# Patient Record
Sex: Female | Born: 1965
Health system: Southern US, Community
[De-identification: ages and names within clinical notes are randomized; demographics above are authoritative.]

## PROBLEM LIST (undated history)

## (undated) DIAGNOSIS — K219 Gastro-esophageal reflux disease without esophagitis: Secondary | ICD-10-CM

## (undated) DIAGNOSIS — D649 Anemia, unspecified: Secondary | ICD-10-CM

## (undated) DIAGNOSIS — G43909 Migraine, unspecified, not intractable, without status migrainosus: Secondary | ICD-10-CM

## (undated) DIAGNOSIS — G4700 Insomnia, unspecified: Secondary | ICD-10-CM

## (undated) DIAGNOSIS — F419 Anxiety disorder, unspecified: Secondary | ICD-10-CM

## (undated) DIAGNOSIS — M1990 Unspecified osteoarthritis, unspecified site: Secondary | ICD-10-CM

## (undated) HISTORY — DX: Migraine, unspecified, not intractable, without status migrainosus: G43.909

## (undated) HISTORY — DX: Unspecified osteoarthritis, unspecified site: M19.90

## (undated) HISTORY — PX: UPPER GASTROINTESTINAL ENDOSCOPY: SHX188

## (undated) HISTORY — DX: Anemia, unspecified: D64.9

## (undated) HISTORY — DX: Anxiety disorder, unspecified: F41.9

## (undated) HISTORY — DX: Insomnia, unspecified: G47.00

## (undated) HISTORY — DX: Gastro-esophageal reflux disease without esophagitis: K21.9

---

## 1999-06-29 ENCOUNTER — Other Ambulatory Visit: Admission: RE | Admit: 1999-06-29 | Discharge: 1999-06-29 | Payer: Self-pay | Admitting: Obstetrics and Gynecology

## 2000-06-27 ENCOUNTER — Other Ambulatory Visit: Admission: RE | Admit: 2000-06-27 | Discharge: 2000-06-27 | Payer: Self-pay | Admitting: Obstetrics and Gynecology

## 2001-08-12 ENCOUNTER — Other Ambulatory Visit: Admission: RE | Admit: 2001-08-12 | Discharge: 2001-08-12 | Payer: Self-pay | Admitting: Obstetrics and Gynecology

## 2008-12-30 HISTORY — PX: TUBAL LIGATION: SHX77

## 2010-07-12 ENCOUNTER — Ambulatory Visit (HOSPITAL_COMMUNITY): Admission: RE | Admit: 2010-07-12 | Discharge: 2010-07-12 | Payer: Self-pay | Admitting: Internal Medicine

## 2011-03-17 LAB — COMPREHENSIVE METABOLIC PANEL
ALT: 33 U/L (ref 0–35)
AST: 31 U/L (ref 0–37)
CO2: 30 mEq/L (ref 19–32)
Calcium: 10 mg/dL (ref 8.4–10.5)
Creatinine, Ser: 0.88 mg/dL (ref 0.4–1.2)
GFR calc Af Amer: >60 mL/min (ref >60)
GFR calc non Af Amer: >60 mL/min (ref >60)
Sodium: 139 mEq/L (ref 135–145)
Total Protein: 7.8 g/dL (ref 6.0–8.3)

## 2011-03-17 LAB — CBC
HCT: 41.5 % (ref 36.0–46.0)
Hemoglobin: 13.7 g/dL (ref 12.0–15.0)
MCHC: 33 g/dL (ref 30.0–36.0)
MCV: 84.9 fL (ref 78.0–100.0)
RDW: 13.4 % (ref 11.5–15.5)

## 2011-03-17 LAB — DIFFERENTIAL
Basophils Relative: 0 % (ref 0–1)
Eosinophils Relative: 1 % (ref 0–5)
Lymphocytes Relative: 39 % (ref 12–46)
Monocytes Absolute: 0.5 10*3/uL (ref 0.1–1.0)
Monocytes Relative: 19 % — ABNORMAL HIGH (ref 3–12)
Neutro Abs: 1 10*3/uL — ABNORMAL LOW (ref 1.7–7.7)

## 2012-08-03 ENCOUNTER — Ambulatory Visit (INDEPENDENT_AMBULATORY_CARE_PROVIDER_SITE_OTHER): Payer: 59 | Admitting: Medical

## 2012-08-03 ENCOUNTER — Encounter: Payer: Self-pay | Admitting: Medical

## 2012-08-03 VITALS — BP 120/80 | HR 76 | Temp 98.1°F | Resp 16 | Ht 67.0 in | Wt 134.0 lb

## 2012-08-03 DIAGNOSIS — B029 Zoster without complications: Secondary | ICD-10-CM

## 2012-08-03 MED ORDER — IBUPROFEN 800 MG PO TABS: 800.0000 mg | ORAL_TABLET | Freq: Three times a day (TID) | ORAL | Status: AC | PRN

## 2012-08-03 MED ORDER — VALACYCLOVIR HCL 1 G PO TABS: 1000.0000 mg | ORAL_TABLET | Freq: Three times a day (TID) | ORAL | Status: AC

## 2012-08-03 NOTE — Progress Notes (Signed)
Subjective: Here as a new patient today.  Was formerly Engineer, drilling.   Her husband recently started coming here as a patient.  This past started getting Veloso on her right side stretching from mid front of torso to her back.   Never had prior similar Ellerman.   She was eating tomatoes last week and thought this was an allergic reaction.  denies any recent contacts with similar Goth.   Wernick it itchy, feels like fire, moderately painful.  Using some Pamprin or Advil OTC for the pain.    Review of Systems Constitutional: -fever, +chills, -sweats, -unexpected -weight change, +fatigue ENT: -runny nose, -ear pain, -sore throat Cardiology:  -chest pain, -palpitations, -edema Respiratory: -cough, -shortness of breath, -wheezing Gastroenterology: -abdominal pain, -nausea, -vomiting, -diarrhea, -constipation  Musculoskeletal: -arthralgias, -myalgias, -joint swelling, -back pain Ophthalmology: -vision changes Urology: -dysuria, -difficulty urinating, -hematuria, -urinary frequency, -urgency Neurology: +insomnia, -headache, -weakness, -tingling, -numbness   Past Medical History  Diagnosis Date  . Insomnia   . Anxiety     Dr. Kevan Ny, Debor[place] Sprang  . Migraine      Objective:   Physical Exam  General appearance: alert, no distress, WD/WN, pleasant AA female Skin: T10 dermatome with long patch of vesicles on erythematous base extending form mid clavicular line to flank Neck: supple, no lymphadenopathy, no thyromegaly, no masses Heart: RRR, normal S1, S2, no murmurs Lungs: CTA bilaterally, no wheezes, rhonchi, or rales Abdomen: +bs, soft, non tender, non distended, no masses, no hepatomegaly, no splenomegaly Pulses: 2+ symmetric   Assessment and Plan :     Encounter Diagnosis  Name Primary?  . Shingles outbreak Yes   Discussed symptoms, diagnosis, treatment, and prevention, also discussed possibility of post herpetic neuralgia or recurrence.    Script today for Valtrex and Ibuprofen.   Advised  rest, avoid touching the area, and call or return if not improving or worse.   Answered her questions and handout given.

## 2012-08-03 NOTE — Patient Instructions (Signed)
Shingles Shingles is caused by the same virus that causes chickenpox (varicella zoster virus or VZV). Shingles often occurs many years or decades after having chickenpox. That is why it is more common in adults older than 50 years. The virus reactivates and breaks out as an infection in a nerve root. SYMPTOMS   The initial feeling (sensations) may be pain. This pain is usually described as:   Burning.   Stabbing.   Throbbing.   Tingling in the nerve root.   A red Raboin will follow in a couple days. The Reisner may occur in any area of the body and is usually on one side (unilateral) of the body in a band or belt-like pattern. The Boardman usually starts out as very small blisters (vesicles). They will dry up after 7 to 10 days. This is not usually a significant problem except for the pain it causes.   Long-lasting (chronic) pain is more likely in an elderly person. It can last months to years. This condition is called postherpetic neuralgia.  Shingles can be an extremely severe infection in someone with AIDS, a weakened immune system, or with forms of leukemia. It can also be severe if you are taking transplant medicines or other medicines that weaken the immune system. TREATMENT  Your caregiver will often treat you with:  Antiviral drugs.   Anti-inflammatory drugs.   Pain medicines.  Bed rest is very important in preventing the pain associated with herpes zoster (postherpetic neuralgia). Application of heat in the form of a hot water bottle or electric heating pad or gentle pressure with the hand is recommended to help with the pain or discomfort. PREVENTION  A varicella zoster vaccine is available to help protect against the virus. The Food and Drug Administration approved the varicella zoster vaccine for individuals 50 years of age and older. HOME CARE INSTRUCTIONS   Cool compresses to the area of Ronk may be helpful.   Only take over-the-counter or prescription medicines for pain,  discomfort, or fever as directed by your caregiver.   Avoid contact with:   Babies.   Pregnant women.   Children with eczema.   Elderly people with transplants.   People with chronic illnesses, such as leukemia and AIDS.   If the area involved is on your face, you may receive a referral for follow-up to a specialist. It is very important to keep all follow-up appointments. This will help avoid eye complications, chronic pain, or disability.  SEEK IMMEDIATE MEDICAL CARE IF:   You develop any pain (headache) in the area of the face or eye. This must be followed carefully by your caregiver or ophthalmologist. An infection in part of your eye (cornea) can be very serious. It could lead to blindness.   You do not have pain relief from prescribed medicines.   Your redness or swelling spreads.   The area involved becomes very swollen and painful.   You have a fever.   You notice any red or painful lines extending away from the affected area toward your heart (lymphangitis).   Your condition is worsening or has changed.  Document Released: 12/16/2005 Document Revised: 12/05/2011 Document Reviewed: 11/20/2009 Exit[employer] Patient Information 2012 [employer]. 

## 2012-08-05 ENCOUNTER — Other Ambulatory Visit: Payer: Self-pay | Admitting: Medical

## 2012-08-05 ENCOUNTER — Telehealth: Payer: Self-pay | Admitting: Medical

## 2012-08-05 MED ORDER — HYDROCODONE-ACETAMINOPHEN 5-500 MG PO TABS: 1.0000 | ORAL_TABLET | Freq: Four times a day (QID) | ORAL | Status: AC | PRN

## 2012-08-05 NOTE — Progress Notes (Signed)
Rx was called to [facility] per Crosby Oyster PA-C. CLS

## 2012-08-05 NOTE — Telephone Encounter (Signed)
pls call out Vicodin for pain.  Caution that this can cause sedation and constipation but should help

## 2012-08-05 NOTE — Telephone Encounter (Signed)
Patient was notified that her medication was called and the side effects. CLS

## 2012-10-29 ENCOUNTER — Other Ambulatory Visit: Payer: Self-pay | Admitting: Family Medicine

## 2012-10-29 DIAGNOSIS — Z1231 Encounter for screening mammogram for malignant neoplasm of breast: Secondary | ICD-10-CM

## 2012-12-07 ENCOUNTER — Ambulatory Visit
Admission: RE | Admit: 2012-12-07 | Discharge: 2012-12-07 | Disposition: A | Discharge status: Home / Self Care | Payer: 59 | Source: Ambulatory Visit | Attending: Family Medicine | Admitting: Family Medicine

## 2012-12-07 DIAGNOSIS — Z1231 Encounter for screening mammogram for malignant neoplasm of breast: Secondary | ICD-10-CM

## 2013-03-11 ENCOUNTER — Telehealth: Payer: Self-pay | Admitting: Medical

## 2013-03-11 NOTE — Telephone Encounter (Signed)
Pt was called because she is emergency contact for her husband, Aneta Mins Sigala, and we were unable to get in touch with him. When I talked to her to get phillip's new number she informed me that we had called in wrong dose for her celexa. I checked the system and informed pt that we didn't call in this medication. I called [facility], to check out who called it in and was informed that the medication was given in her in error. The pharmacy informed me that an error was made and she was given someone else medication. It is just a coincidence that it is the same medication both she and her husband take. [facility] that [facility] would [facility] that [facility] and get everything straightened out. I did call pt back and informed her about the error and [facility] would be [facility]. I am sending this the Abigail Allen just as an [place].

## 2013-03-12 NOTE — Telephone Encounter (Signed)
This is the one we talked about yesterday.  So did you actually call out his antidepressant?

## 2013-03-12 NOTE — Telephone Encounter (Signed)
Per you instructions yes and he scheduled an appointment

## 2014-01-07 IMAGING — MG MM DIGITAL SCREENING BILAT
4 series · 4 of 4 positions shown · non-contrast
Comparison: None.

CLINICAL DATA: Screening.

DIGITAL BILATERAL SCREENING MAMMOGRAM WITH CAD

[R CC]
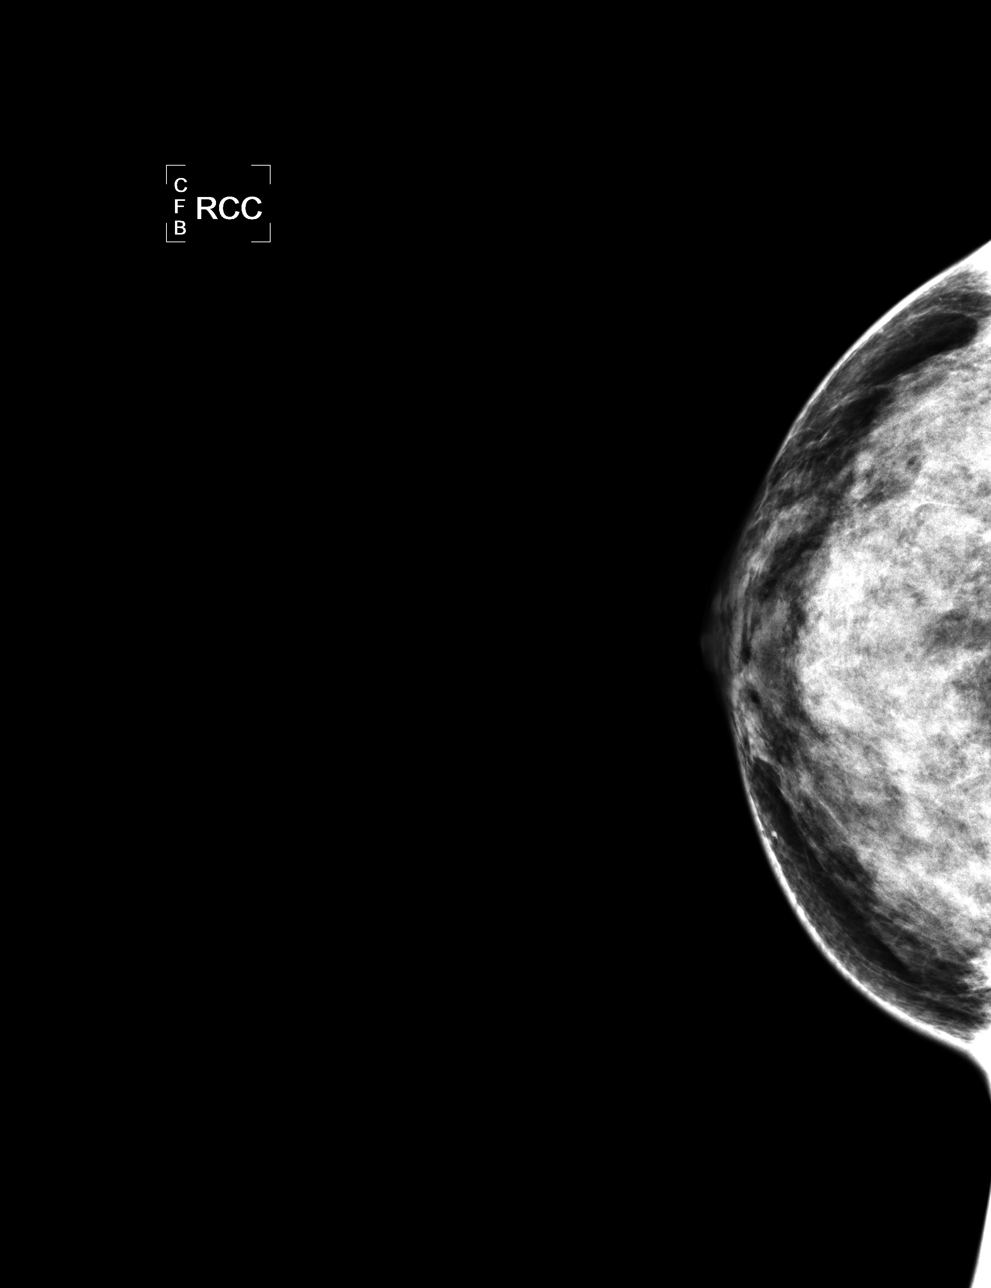

[L CC]
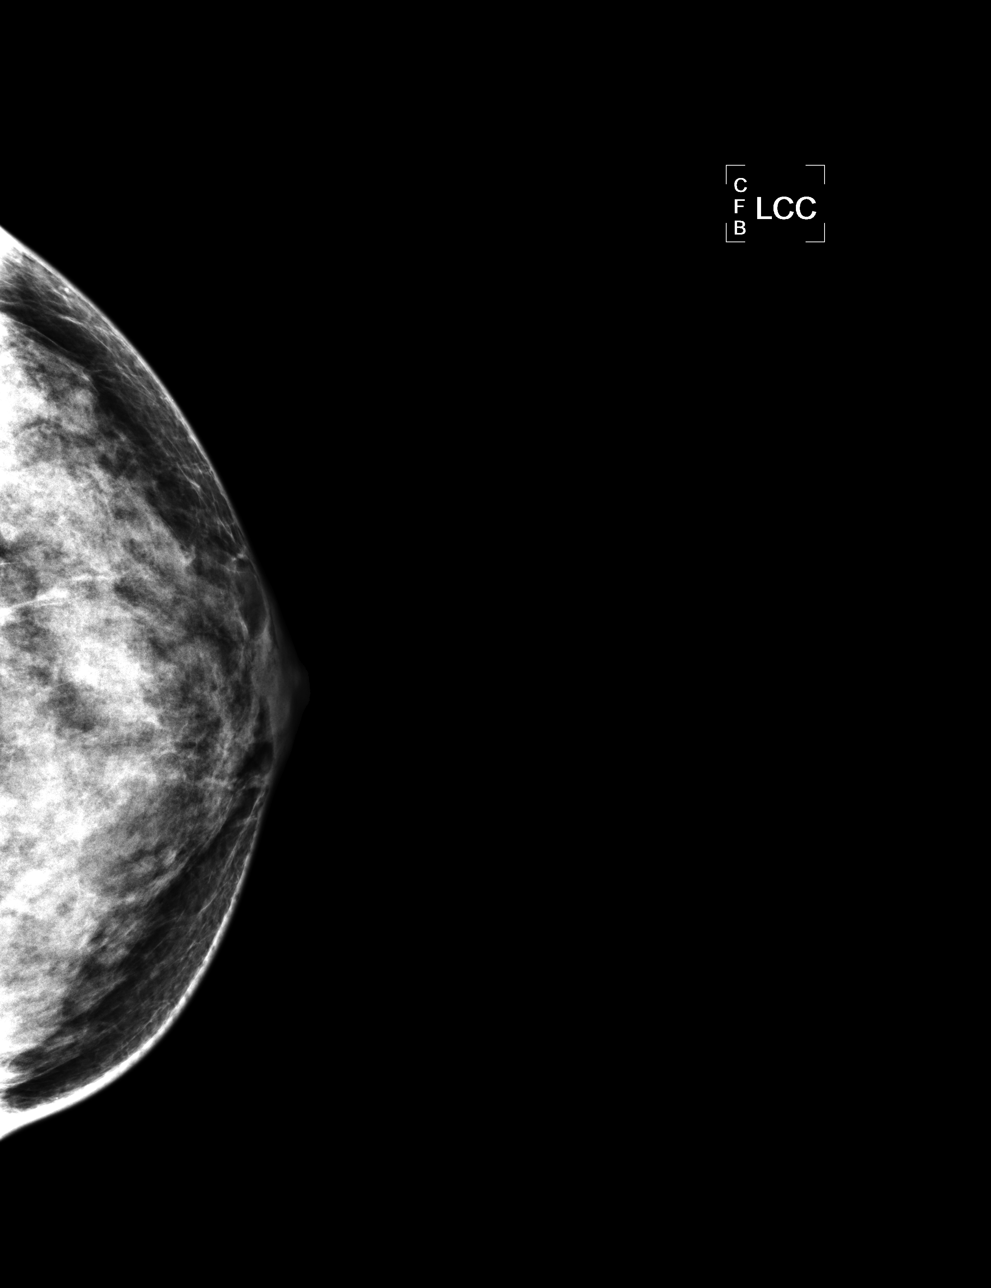

[L MLO]
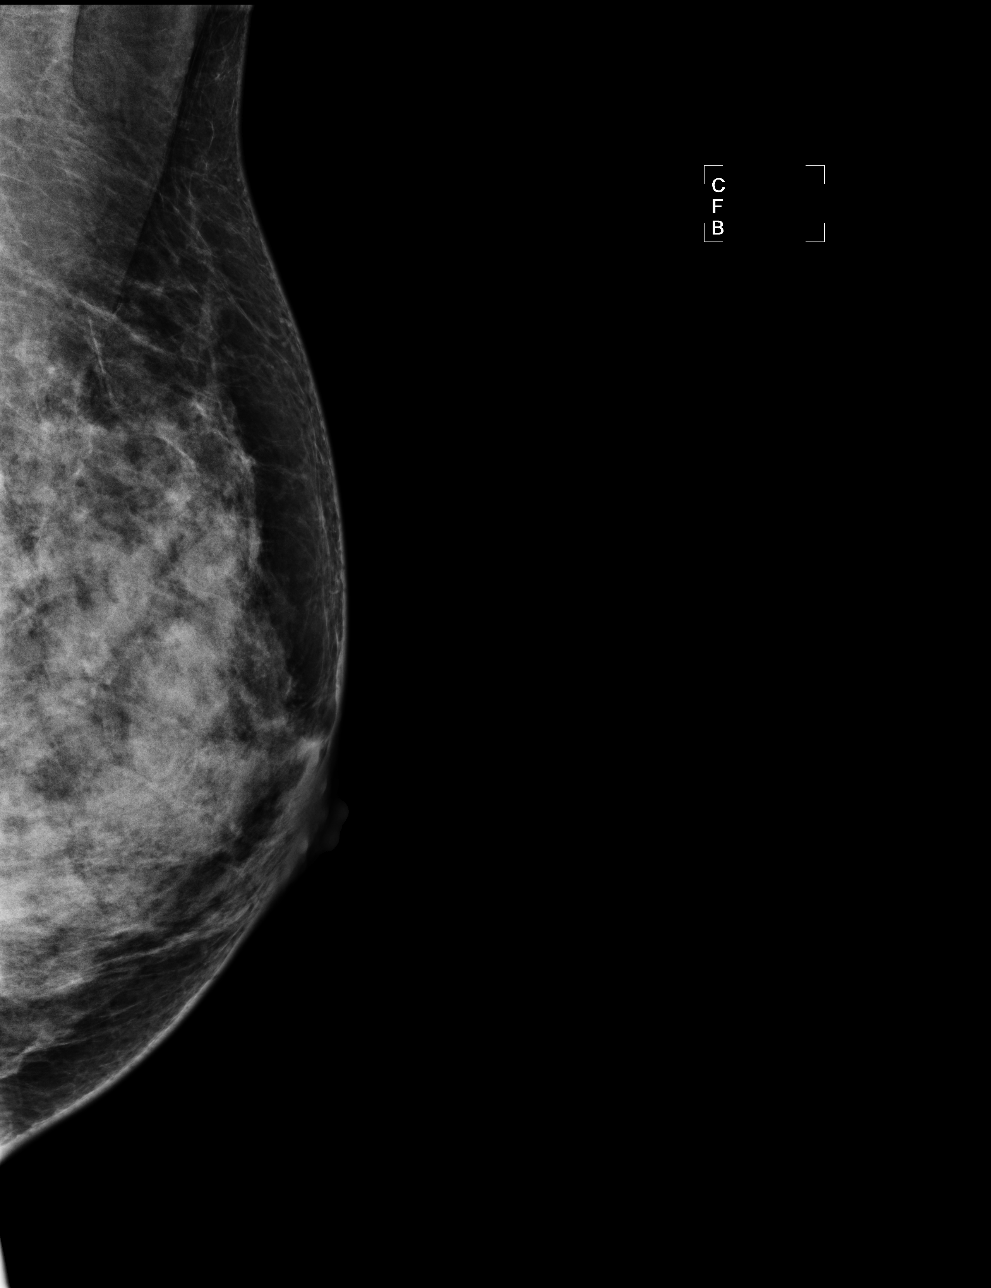

[R MLO]
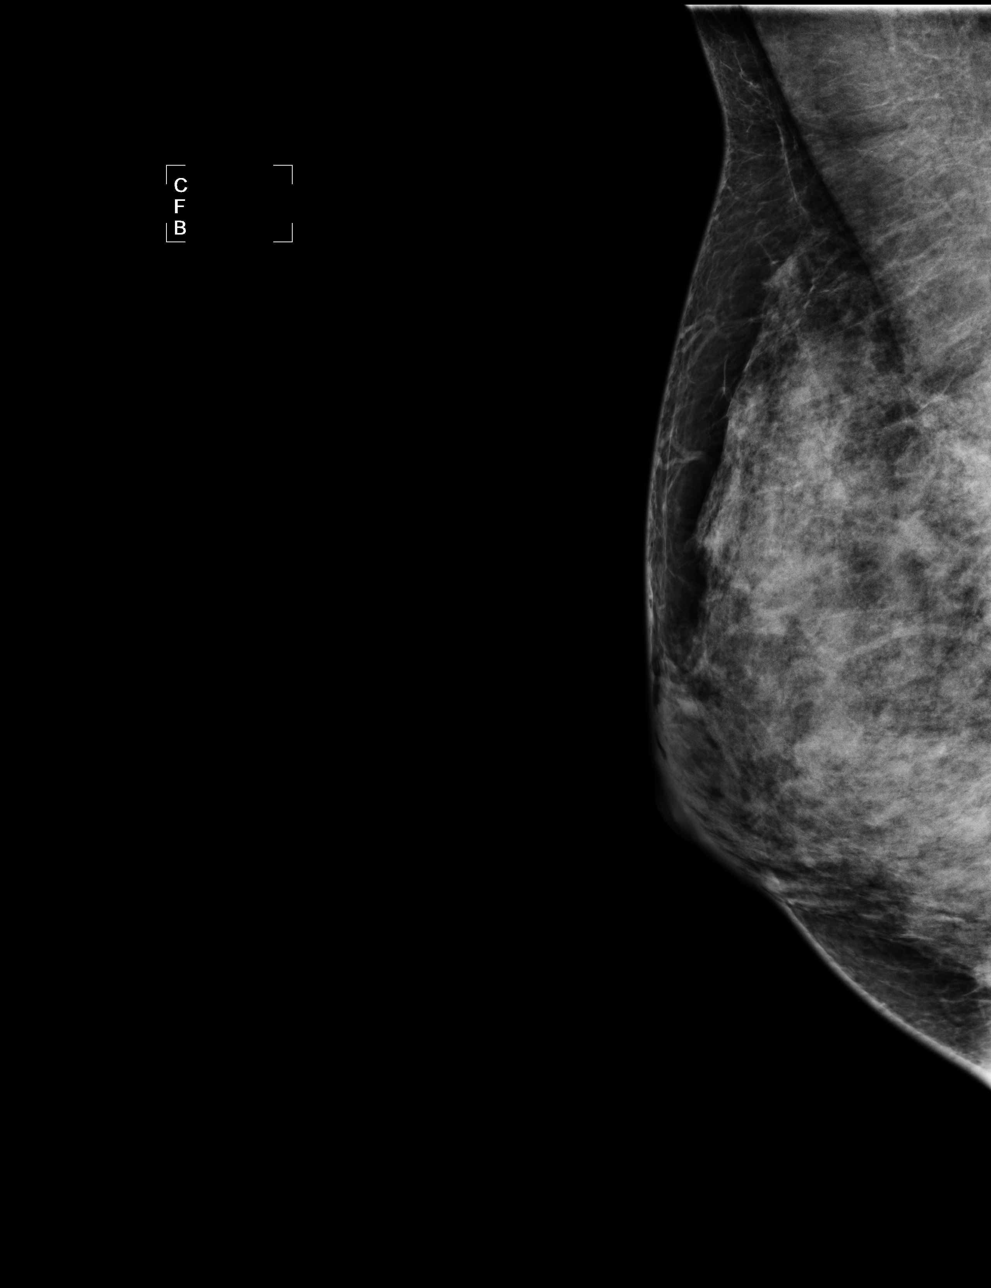

[4 of 4 positions shown; findings below may reference images not displayed]

FINDINGS: ACR Breast Density Category 4: The breast tissue is extremely
dense.

No suspicious masses, architectural distortion, or calcifications
are present.

Images were processed with CAD.
IMPRESSION: No mammographic evidence of malignancy.
A result letter of this screening mammogram will be mailed directly
to the patient.

RECOMMENDATION:
Screening mammogram in one year. (Code:HN-Z-2X1)

BI-RADS CATEGORY 1:  Negative.

## 2016-12-06 ENCOUNTER — Encounter: Payer: Self-pay | Admitting: Physician Assistant

## 2016-12-13 ENCOUNTER — Other Ambulatory Visit (INDEPENDENT_AMBULATORY_CARE_PROVIDER_SITE_OTHER): Payer: 59

## 2016-12-13 ENCOUNTER — Encounter: Payer: Self-pay | Admitting: Physician Assistant

## 2016-12-13 ENCOUNTER — Ambulatory Visit (INDEPENDENT_AMBULATORY_CARE_PROVIDER_SITE_OTHER): Payer: 59 | Admitting: Physician Assistant

## 2016-12-13 ENCOUNTER — Encounter (INDEPENDENT_AMBULATORY_CARE_PROVIDER_SITE_OTHER): Payer: Self-pay

## 2016-12-13 VITALS — BP 106/70 | HR 92 | Ht 65.5 in | Wt 141.4 lb

## 2016-12-13 DIAGNOSIS — K5900 Constipation, unspecified: Secondary | ICD-10-CM

## 2016-12-13 DIAGNOSIS — R1310 Dysphagia, unspecified: Secondary | ICD-10-CM

## 2016-12-13 DIAGNOSIS — K921 Melena: Secondary | ICD-10-CM

## 2016-12-13 DIAGNOSIS — K219 Gastro-esophageal reflux disease without esophagitis: Secondary | ICD-10-CM

## 2016-12-13 LAB — CBC WITH DIFFERENTIAL/PLATELET
BASOS PCT: 0.4 % (ref 0.0–3.0)
Basophils Absolute: 0 10*3/uL (ref 0.0–0.1)
EOS PCT: 1.7 % (ref 0.0–5.0)
Eosinophils Absolute: 0.1 10*3/uL (ref 0.0–0.7)
HEMATOCRIT: 41.3 % (ref 36.0–46.0)
HEMOGLOBIN: 13.5 g/dL (ref 12.0–15.0)
LYMPHS PCT: 27 % (ref 12.0–46.0)
Lymphs Abs: 1.4 10*3/uL (ref 0.7–4.0)
MCHC: 32.7 g/dL (ref 30.0–36.0)
MCV: 82.4 fl (ref 78.0–100.0)
MONOS PCT: 12.2 % — AB (ref 3.0–12.0)
Monocytes Absolute: 0.6 10*3/uL (ref 0.1–1.0)
NEUTROS ABS: 3 10*3/uL (ref 1.4–7.7)
Neutrophils Relative %: 58.7 % (ref 43.0–77.0)
PLATELETS: 212 10*3/uL (ref 150.0–400.0)
RBC: 5.01 Mil/uL (ref 3.87–5.11)
RDW: 14.3 % (ref 11.5–15.5)
WBC: 5 10*3/uL (ref 4.0–10.5)

## 2016-12-13 MED ORDER — ESOMEPRAZOLE MAGNESIUM 40 MG PO CPDR: 40.0000 mg | DELAYED_RELEASE_CAPSULE | Freq: Two times a day (BID) | ORAL | 3 refills | Status: DC

## 2016-12-13 NOTE — Progress Notes (Signed)
Agree with assessment and plan as outlined.  

## 2016-12-13 NOTE — Patient Instructions (Signed)
You have been scheduled for an endoscopy. Please follow written instructions given to you at your visit today. If you use inhalers (even only as needed), please bring them with you on the day of your procedure. Your physician has requested that you go to www.startemmi.com and enter the access code given to you at your visit today. This web site gives a general overview about your procedure. However, you should still follow specific instructions given to you by our office regarding your preparation for the procedure.  We have sent the following medications to your pharmacy for you to pick up at your convenience: Nexium 40 mg twice a day 30 mins before breakfast and lunch.   Your physician has requested that you go to the basement for the following lab work before leaving today: CBC

## 2016-12-13 NOTE — Progress Notes (Signed)
Chief Complaint: GERD, Melena, Change in bowel habits, Dysphagia  HPI:  Abigail Allen is a 50 year old  [place] female with a past medical history of anxiety,  who was referred to me by Shaune PollackGates, Donna, MD for a complaint of GERD, melena, change in bowel habits and dysphagia. Patient describes remote history of following in our clinic previously but cannot remember which doctor this was. Apparently she had EGD with ulcers per report.   Today, the patient tells me that for the past few months she has had an increase in reflux, dysphagia, nausea and has seen some melena. She describes that she is typically on Nexium 40 mg every day, but occasionally will forget a dose and a couple of months ago stopped using this medication because she "just got tired of taking it". Around the same time the patient was also using TheraFlu and Aleve 3-4 tabs at a time for pain and started to develop an increase in reflux symptoms that would wake her up at night, she would be lying in bed and she would awaken with a feeling of being "strangled". This occurred 3 times during 1 night and her husband told her to go to the doctor. She restarted Nexium 40 mg daily after seeing her PCP about a month ago and has had a decrease of these symptoms. She does continue with dysphagia noting that sometimes she feels as though food gets stuck in her throat while she is eating. Patient also describes seeing black/sticky stools occasionally over the past couple of months.   Patient also tells me that her work schedule was changed recently, so she is working 6-6 Monday through Wednesday and then is off the rest of the week. Those days that she is working she usually does not have a bowel movement at all when typically she had a bowel movement every morning before this time.  Patient tells me that she "doesn't like to poop at work". Patient did try using MiraLAX last week and did have a bowel movement while at work. She describes that when she  does have a bowel movement she is often nauseous afterwards.   Patient denies fever, chills, bright red blood in her stool, weight loss, fatigue, anorexia or  vomiting.   Past Medical History:  Diagnosis Date  . Anxiety    Dr. Kevan NyGates, [place]  . Insomnia   . Migraine     No past surgical history on file.  Current Outpatient Prescriptions  Medication Sig Dispense Refill  . Methylsulfonylmethane (MSM) 1000 MG CAPS Take by mouth.    . vitamin B-12 (CYANOCOBALAMIN) 1000 MCG tablet Take 1,000 mcg by mouth daily.     No current facility-administered medications for this visit.     Allergies as of 12/13/2016  . (No Known Allergies)    No family history on file.  Social History   Social History  . Marital status: Married    Spouse name: N/A  . Number of children: N/A  . Years of education: N/A   Occupational History  . Not on file.   Social History Main Topics  . Smoking status: Not on file  . Smokeless tobacco: Not on file  . Alcohol use Not on file  . Drug use: Unknown  . Sexual activity: Not on file   Other Topics Concern  . Not on file   Social History Narrative  . No narrative on file    Review of Systems:     Constitutional: No weight loss, fever,  chills, weakness or fatigue Skin: No Nelon or itching Cardiovascular: No chest pain Respiratory: No SOB  Gastrointestinal: See HPI and otherwise negative Genitourinary: No dysuria or change in urinary frequency Neurological: No headache, dizziness or syncope Musculoskeletal: No new muscle or joint pain Hematologic: No bruising Psychiatric: Positive for anxiety No history of depression   Physical Exam:  Vital signs: BP 106/70 (BP Location: Left Arm, Patient Position: Sitting, Cuff Size: Normal)   Pulse 92   Ht 5' 5.5" (1.664 m) Comment: height measured without shoes  Wt 141 lb 6 oz (64.1 kg)   LMP 11/18/2016   BMI 23.17 kg/m    Constitutional:   Pleasant African-American female appears to be in NAD, Well  developed, Well nourished, alert and cooperative Head:  Normocephalic and atraumatic. Eyes:   PEERL, EOMI. No icterus. Conjunctiva pink. Ears:  Normal auditory acuity. Neck:  Supple Throat: Oral cavity and pharynx without inflammation, swelling or lesion.  Respiratory: Respirations even and unlabored. Lungs clear to auscultation bilaterally.   No wheezes, crackles, or rhonchi.  Cardiovascular: Normal S1, S2. No MRG. Regular rate and rhythm. No peripheral edema, cyanosis or pallor.  Gastrointestinal:  Soft, nondistended, nontender. No rebound or guarding. Normal bowel sounds. No appreciable masses or hepatomegaly. Rectal:  Not performed.  Msk:  Symmetrical without gross deformities. Without edema, no deformity or joint abnormality.  Neurologic:  Alert and  oriented x4;  grossly normal neurologically.  Skin:   Dry and intact without significant lesions or rashes. Psychiatric: Demonstrates good judgement and reason without abnormal affect or behaviors.  No recent labs or imaging.  Assessment: 1. GERD: Patient describes breakthrough symptoms and also symptoms that awaken her from sleep at night, this is some better after restarting Nexium 40 mg qd consistently on a daily basis 2. Dysphagia: Intermittent symptoms over the past few months, likely related to uncontrolled reflux above and likely stricture; also consider esophageal ring versus web versus mass 3. Melena: She reports black sticky stools over the past couple of months intermittently, no shortness of breath or syncope, likely related to NSAID use 4. Constipation: She describes constipation on days of 12 hour shifts, bowel movements after these days are associated with some nausea; consider constipation versus IBS  Plan: 1. Prescribe Nexium 40 mg twice a day, 30-60 minutes before eating breakfast and lunch #60 with 3 refills 2. Provide the patient with an anti-reflux diet and lifestyle handout. Did discuss this in detail. 3. Patient  should abstain from NSAIDs 4. Scheduled the patient for an EGD with Dr. Adela LankArmbruster as he had a sooner appointment. Discussed risks, benefits, limitations and alternatives and the patient agrees to proceed. This was schedule an [facility]. This will likely need to be performed with dilation 5. Reviewed anti-dysphagia measures including taking small bites, taking sips of water in between, avoiding distraction while eating and the chin tuck technique. 6. Recommend the patient use MiraLAX on a daily basis when she is working 12 hours, this will help her avoid days of constipation and hopefully nausea 7. Ordered CBC to make sure patient is not severely anemic 8. Patient to follow in clinic with Dr. Adela LankArmbruster per his recommendations after time procedure.  Hyacinth MeekerJennifer Kwamane Whack, PA-C [facility] 12/13/2016, 9:16 AM  Cc: Shaune PollackGates, Donna, MD

## 2016-12-25 ENCOUNTER — Ambulatory Visit (AMBULATORY_SURGERY_CENTER): Payer: 59 | Admitting: Gastroenterology

## 2016-12-25 ENCOUNTER — Encounter: Payer: Self-pay | Admitting: Gastroenterology

## 2016-12-25 VITALS — BP 133/80 | HR 83 | Temp 98.0°F | Resp 14 | Ht 65.5 in | Wt 141.0 lb

## 2016-12-25 DIAGNOSIS — R1310 Dysphagia, unspecified: Secondary | ICD-10-CM

## 2016-12-25 MED ORDER — SODIUM CHLORIDE 0.9 % IV SOLN: 500.0000 mL | INTRAVENOUS | Status: DC

## 2016-12-25 NOTE — Patient Instructions (Addendum)
YOU HAD AN ENDOSCOPIC PROCEDURE TODAY AT THE [facility]:   Refer to the procedure report that was given to you for any specific questions about what was found during the examination.  If the procedure report does not answer your questions, please call your gastroenterologist to clarify.  If you requested that your care partner not be given the details of your procedure findings, then the procedure report has been included in a sealed envelope for you to review at your convenience later.  YOU SHOULD EXPECT: Some feelings of bloating in the abdomen. Passage of more gas than usual.  Walking can help get rid of the air that was put into your GI tract during the procedure and reduce the bloating. If you had a lower endoscopy (such as a colonoscopy or flexible sigmoidoscopy) you may notice spotting of blood in your stool or on the toilet paper. If you underwent a bowel prep for your procedure, you may not have a normal bowel movement for a few days.  Please Note:  You might notice some irritation and congestion in your nose or some drainage.  This is from the oxygen used during your procedure.  There is no need for concern and it should clear up in a day or so.  SYMPTOMS TO REPORT IMMEDIATELY:   Following lower endoscopy (colonoscopy or flexible sigmoidoscopy):  Excessive amounts of blood in the stool  Significant tenderness or worsening of abdominal pains  Swelling of the abdomen that is new, acute  Fever of 100F or higher  For urgent or emergent issues, a gastroenterologist can be reached at any hour by calling [phone].   DIET:  Follow Post Esophageal Dilatation Diet.  Drink plenty of fluids but you should avoid alcoholic beverages for 24 hours.  ACTIVITY:  You should plan to take it easy for the rest of today and you should NOT DRIVE or use heavy machinery until tomorrow (because of the sedation medicines used during the test).    FOLLOW UP: Our staff will call the number  listed on your records the next business day following your procedure to check on you and address any questions or concerns that you may have regarding the information given to you following your procedure. If we do not reach you, we will leave a message.  However, if you are feeling well and you are not experiencing any problems, there is no need to return our call.  We will assume that you have returned to your regular daily activities without incident.  If any biopsies were taken you will be contacted by phone or by letter within the next 1-3 weeks.  Please call us at [phone] if you have not heard about the biopsies in 3 weeks.    Await biopsy results. Schedule Colonoscopy   SIGNATURES/CONFIDENTIALITY: You and/or your care partner have signed paperwork which will be entered into your electronic medical record.  These signatures attest to the fact that that the information above on your After Visit Summary has been reviewed and is understood.  Full responsibility of the confidentiality of this discharge information lies with you and/or your care-partner.YOU HAD AN ENDOSCOPIC PROCEDURE TODAY AT THE [facility]:   Refer to the procedure report that was given to you for any specific questions about what was found during the examination.  If the procedure report does not answer your questions, please call your gastroenterologist to clarify.  If you requested that your care partner not be given the details  of your procedure findings, then the procedure report has been included in a sealed envelope for you to review at your convenience later.  YOU SHOULD EXPECT: Some feelings of bloating in the abdomen. Passage of more gas than usual.  Walking can help get rid of the air that was put into your GI tract during the procedure and reduce the bloating. If you had a lower endoscopy (such as a colonoscopy or flexible sigmoidoscopy) you may notice spotting of blood in your stool or on the toilet  paper. If you underwent a bowel prep for your procedure, you may not have a normal bowel movement for a few days.  Please Note:  You might notice some irritation and congestion in your nose or some drainage.  This is from the oxygen used during your procedure.  There is no need for concern and it should clear up in a day or so.  SYMPTOMS TO REPORT IMMEDIATELY:  Following upper endoscopy (EGD)  Vomiting of blood or coffee ground material  New chest pain or pain under the shoulder blades  Painful or persistently difficult swallowing  New shortness of breath  Fever of 100F or higher  Black, tarry-looking stools  For urgent or emergent issues, a gastroenterologist can be reached at any hour by calling [phone].   DIET: Follow Post Esophageal Dilatation Diet.  Drink plenty of fluids but you should avoid alcoholic beverages for 24 hours.  ACTIVITY:  You should plan to take it easy for the rest of today and you should NOT DRIVE or use heavy machinery until tomorrow (because of the sedation medicines used during the test).    FOLLOW UP: Our staff will call the number listed on your records the next business day following your procedure to check on you and address any questions or concerns that you may have regarding the information given to you following your procedure. If we do not reach you, we will leave a message.  However, if you are feeling well and you are not experiencing any problems, there is no need to return our call.  We will assume that you have returned to your regular daily activities without incident.  If any biopsies were taken you will be contacted by phone or by letter within the next 1-3 weeks.  Please call us at [phone] if you have not heard about the biopsies in 3 weeks. Await Biopsy Results  Schedule Colonoscopy   SIGNATURES/CONFIDENTIALITY: You and/or your care partner have signed paperwork which will be entered into your electronic medical record.  These  signatures attest to the fact that that the information above on your After Visit Summary has been reviewed and is understood.  Full responsibility of the confidentiality of this discharge information lies with you and/or your care-partner.

## 2016-12-25 NOTE — Progress Notes (Signed)
To PACU, vss patent aw report to rn 

## 2016-12-25 NOTE — Op Note (Signed)
[facility] Patient Name: Abigail BlasShirlene Arenson Procedure Date: 12/25/2016 2:22 PM MRN: [id number] Endoscopist: Viviann SpareSteven P. Cristian Grieves MD, MD Age: 50 Referring MD:  Date of Birth: 04/12/1966 Gender: Female Account #: [id number] Procedure:                Upper GI endoscopy Indications:              Dysphagia, reported symptoms of melena / dark                            stools in setting of NSAIDs, GERD, now improved on                            PPI. CBC normal Medicines:                Monitored Anesthesia Care Procedure:                Pre-Anesthesia Assessment:                           - Prior to the procedure, a History and Physical                            was performed, and patient medications and                            allergies were reviewed. The patient's tolerance of                            previous anesthesia was also reviewed. The risks                            and benefits of the procedure and the sedation                            options and risks were discussed with the patient.                            All questions were answered, and informed consent                            was obtained. Prior Anticoagulants: The patient has                            taken no previous anticoagulant or antiplatelet                            agents. ASA Grade Assessment: II - A patient with                            mild systemic disease. After reviewing the risks                            and benefits, the patient was deemed in  satisfactory condition to undergo the procedure.                           After obtaining informed consent, the endoscope was                            passed under direct vision. Throughout the                            procedure, the patient's blood pressure, pulse, and                            oxygen saturations were monitored continuously. The                            Model GIF-[id number]) scope  was introduced                            through the mouth, and advanced to the second part                            of duodenum. The upper GI endoscopy was                            accomplished without difficulty. The patient                            tolerated the procedure well. Scope In: Scope Out: Findings:                 Esophagogastric landmarks were identified: the                            Z-line was found at 39 cm, the gastroesophageal                            junction was found at 39 cm and the upper extent of                            the gastric folds was found at 39 cm from the                            incisors.                           The exam of the esophagus was otherwise normal. No                            inflammatory changes or stenosis.                           A guidewire was placed and the scope was withdrawn.                            Dilation was  performed in the entire esophagus with                            a Savary dilator with no resistance at 17 mm and 18                            mm. Relook with the endoscope showed no mucosal                            wrents. Biopsies were taken with a cold forceps in                            the upper third of the esophagus, in the middle                            third of the esophagus and in the lower third of                            the esophagus for histology to rule out                            eosinophilic esophagitis.                           The entire examined stomach was normal.                           The duodenal bulb and second portion of the                            duodenum were normal. Complications:            No immediate complications. Estimated blood loss:                            Minimal. Estimated Blood Loss:     Estimated blood loss was minimal. Impression:               - Esophagogastric landmarks identified.                           - Normal esophagus  otherwise - given symptoms of                            dysphagia empiric dilation performed to 18mm and                            biopsies taken to rule out EoE.                           - Normal stomach.                           - Normal duodenal bulb and second portion of the  duodenum.                           Overall, no pathology noted to cause any potential                            symptoms of bleeding. Recommendation:           - Patient has a contact number available for                            emergencies. The signs and symptoms of potential                            delayed complications were discussed with the                            patient. Return to normal activities tomorrow.                            Written discharge instructions were provided to the                            patient.                           - Resume previous diet.                           - Continue present medications.                           - Await pathology results.                           - Repeat upper endoscopy PRN for retreatment.                           - Recommend colonoscopy for colon cancer screening                            given no prior exams, overdue for screening, and                            reported dark stools Viviann SpareSteven P. Darcell Sabino MD, MD 12/25/2016 3:02:33 PM This report has been signed electronically.

## 2016-12-25 NOTE — Progress Notes (Signed)
Called to room to assist during endoscopic procedure.  Patient ID and intended procedure confirmed with present staff. Received instructions for my participation in the procedure from the performing physician.  

## 2016-12-26 ENCOUNTER — Telehealth: Payer: Self-pay

## 2016-12-26 NOTE — Telephone Encounter (Signed)
  Follow up Call-  Call back number 12/25/2016  Post procedure Call Back phone  # [phone]  Permission to leave phone message Yes  Some recent data might be hidden     Patient questions:  Do you have a fever, pain , or abdominal swelling? No. Pain Score  0 *  Have you tolerated food without any problems? Yes.    Have you been able to return to your normal activities? Yes.    Do you have any questions about your discharge instructions: Diet   No. Medications  No. Follow up visit  No.  Do you have questions or concerns about your Care? No.  Actions: * If pain score is 4 or above: No action needed, pain <4.

## 2017-01-01 ENCOUNTER — Ambulatory Visit (AMBULATORY_SURGERY_CENTER): Payer: Self-pay | Admitting: *Deleted

## 2017-01-01 VITALS — Ht 65.5 in | Wt 141.0 lb

## 2017-01-01 DIAGNOSIS — K921 Melena: Secondary | ICD-10-CM

## 2017-01-01 MED ORDER — NA SULFATE-K SULFATE-MG SULF 17.5-3.13-1.6 GM/177ML PO SOLN: ORAL | 0 refills | Status: DC

## 2017-01-01 NOTE — Progress Notes (Signed)
No egg or soy allergy  No anesthesia or intubation problems per pt  No diet medications taken  No home oxygen used or hx of sleep apnea  $15 off coupon given

## 2017-01-01 NOTE — Addendum Note (Signed)
Addended by: Karl BalesWESTBROOK, KRISTEN B on: 01/01/2017 12:00 PM   Modules accepted: Orders

## 2017-01-02 ENCOUNTER — Encounter: Payer: Self-pay | Admitting: Gastroenterology

## 2017-01-08 ENCOUNTER — Ambulatory Visit (AMBULATORY_SURGERY_CENTER): Payer: 59 | Admitting: Gastroenterology

## 2017-01-08 ENCOUNTER — Encounter: Payer: Self-pay | Admitting: Gastroenterology

## 2017-01-08 VITALS — BP 131/84 | HR 65 | Temp 98.0°F | Resp 13 | Ht 65.0 in | Wt 141.0 lb

## 2017-01-08 DIAGNOSIS — K921 Melena: Secondary | ICD-10-CM

## 2017-01-08 DIAGNOSIS — Z1211 Encounter for screening for malignant neoplasm of colon: Secondary | ICD-10-CM | POA: Diagnosis not present

## 2017-01-08 MED ORDER — SODIUM CHLORIDE 0.9 % IV SOLN: 500.0000 mL | INTRAVENOUS | Status: DC

## 2017-01-08 NOTE — Op Note (Signed)
[facility] Patient Name: Abigail BlasShirlene Allen Procedure Date: 01/08/2017 11:58 AM MRN: [id number] Endoscopist: Viviann SpareSteven P. Armbruster MD, MD Age: 51 Referring MD:  Date of Birth: 12/25/1966 Gender: Female Account #: [id number] Procedure:                Colonoscopy Indications:              This is the patient's first colonoscopy, reported                            symptoms of dark stools / melena with negative EGD,                            no prior colon cancer screening Medicines:                Monitored Anesthesia Care Procedure:                Pre-Anesthesia Assessment:                           - Prior to the procedure, a History and Physical                            was performed, and patient medications and                            allergies were reviewed. The patient's tolerance of                            previous anesthesia was also reviewed. The risks                            and benefits of the procedure and the sedation                            options and risks were discussed with the patient.                            All questions were answered, and informed consent                            was obtained. Prior Anticoagulants: The patient has                            taken no previous anticoagulant or antiplatelet                            agents. ASA Grade Assessment: II - A patient with                            mild systemic disease. After reviewing the risks                            and benefits, the patient was deemed in  satisfactory condition to undergo the procedure.                           After obtaining informed consent, the colonoscope                            was passed under direct vision. Throughout the                            procedure, the patient's blood pressure, pulse, and                            oxygen saturations were monitored continuously. The                            Model [id number]-[id number]  [id number]) scope was introduced                            through the anus and advanced to the the terminal                            ileum, with identification of the appendiceal                            orifice and IC valve. The colonoscopy was performed                            without difficulty. The patient tolerated the                            procedure well. The quality of the bowel                            preparation was good. The terminal ileum, ileocecal                            valve, appendiceal orifice, and rectum were                            photographed. Scope In: 12:22:27 PM Scope Out: 12:36:30 PM Scope Withdrawal Time: 0 hours 11 minutes 40 seconds  Total Procedure Duration: 0 hours 14 minutes 3 seconds  Findings:                 The perianal and digital rectal examinations were                            normal.                           The terminal ileum appeared normal.                           A few small-mouthed diverticula were found in the  ascending colon.                           The exam was otherwise without abnormality on                            direct and retroflexion views. Complications:            No immediate complications. Estimated blood loss:                            None. Estimated Blood Loss:     Estimated blood loss: none. Impression:               - The examined portion of the ileum was normal.                           - Diverticulosis in the ascending colon.                           - The examination was otherwise normal on direct                            and retroflexion views.                           - No polyps.                           Overall, no pathology noted to cause the patient's                            previous symptoms Recommendation:           - Patient has a contact number available for                            emergencies. The signs and symptoms of potential                             delayed complications were discussed with the                            patient. Return to normal activities tomorrow.                            Written discharge instructions were provided to the                            patient.                           - Resume previous diet.                           - Continue present medications.                           -  Repeat colonoscopy in 10 years for screening                            purposes                           - If symptoms of dark stools recur please contact                            me for reassessment Viviann Spare P. Armbruster MD, MD 01/08/2017 12:40:54 PM This report has been signed electronically.

## 2017-01-08 NOTE — Progress Notes (Signed)
A and O x3. Report to RN. Tolerated MAC anesthesia well.

## 2017-01-08 NOTE — Patient Instructions (Signed)
YOU HAD AN ENDOSCOPIC PROCEDURE TODAY AT THE [facility]:   Refer to the procedure report that was given to you for any specific questions about what was found during the examination.  If the procedure report does not answer your questions, please call your gastroenterologist to clarify.  If you requested that your care partner not be given the details of your procedure findings, then the procedure report has been included in a sealed envelope for you to review at your convenience later.  YOU SHOULD EXPECT: Some feelings of bloating in the abdomen. Passage of more gas than usual.  Walking can help get rid of the air that was put into your GI tract during the procedure and reduce the bloating. If you had a lower endoscopy (such as a colonoscopy or flexible sigmoidoscopy) you may notice spotting of blood in your stool or on the toilet paper. If you underwent a bowel prep for your procedure, you may not have a normal bowel movement for a few days.  Please Note:  You might notice some irritation and congestion in your nose or some drainage.  This is from the oxygen used during your procedure.  There is no need for concern and it should clear up in a day or so.  SYMPTOMS TO REPORT IMMEDIATELY:   Following lower endoscopy (colonoscopy or flexible sigmoidoscopy):  Excessive amounts of blood in the stool  Significant tenderness or worsening of abdominal pains  Swelling of the abdomen that is new, acute  Fever of 100F or higher   Following upper endoscopy (EGD)  Vomiting of blood or coffee ground material  New chest pain or pain under the shoulder blades  Painful or persistently difficult swallowing  New shortness of breath  Fever of 100F or higher  Black, tarry-looking stools  For urgent or emergent issues, a gastroenterologist can be reached at any hour by calling [phone].   DIET:  We do recommend a small meal at first, but then you may proceed to your regular diet.  Drink  plenty of fluids but you should avoid alcoholic beverages for 24 hours.  ACTIVITY:  You should plan to take it easy for the rest of today and you should NOT DRIVE or use heavy machinery until tomorrow (because of the sedation medicines used during the test).    FOLLOW UP: Our staff will call the number listed on your records the next business day following your procedure to check on you and address any questions or concerns that you may have regarding the information given to you following your procedure. If we do not reach you, we will leave a message.  However, if you are feeling well and you are not experiencing any problems, there is no need to return our call.  We will assume that you have returned to your regular daily activities without incident.  If any biopsies were taken you will be contacted by phone or by letter within the next 1-3 weeks.  Please call us at [phone] if you have not heard about the biopsies in 3 weeks.    SIGNATURES/CONFIDENTIALITY: You and/or your care partner have signed paperwork which will be entered into your electronic medical record.  These signatures attest to the fact that that the information above on your After Visit Summary has been reviewed and is understood.  Full responsibility of the confidentiality of this discharge information lies with you and/or your care-partner.    Handout was given to your care partner on diverticulosis  You may resume your current medications today. Repeat next colonoscopy in 10 tears for screening purposes. Please call if any questions or concerns.

## 2017-01-08 NOTE — Progress Notes (Signed)
No problems noted in the recovery room. maw 

## 2017-01-09 ENCOUNTER — Telehealth: Payer: Self-pay | Admitting: *Deleted

## 2017-01-09 ENCOUNTER — Telehealth: Payer: Self-pay

## 2017-01-09 NOTE — Telephone Encounter (Signed)
No answer left message and will attempt to call back later this afternoon. Sm 

## 2017-01-09 NOTE — Telephone Encounter (Signed)
  Follow up Call-  Call back number 01/08/2017 12/25/2016  Post procedure Call Back phone  # [phone]  Permission to leave phone message Yes Yes  Some recent data might be hidden    Patient was called for follow up after her procedure on 01/08/2017. No answer at the number given for follow up phone call. A message was left on the answering machine.

## 2017-04-18 DIAGNOSIS — Z01419 Encounter for gynecological examination (general) (routine) without abnormal findings: Secondary | ICD-10-CM | POA: Diagnosis not present

## 2017-04-18 DIAGNOSIS — Z124 Encounter for screening for malignant neoplasm of cervix: Secondary | ICD-10-CM | POA: Diagnosis not present

## 2017-04-18 DIAGNOSIS — Z1231 Encounter for screening mammogram for malignant neoplasm of breast: Secondary | ICD-10-CM | POA: Diagnosis not present

## 2017-06-26 DIAGNOSIS — E7800 Pure hypercholesterolemia, unspecified: Secondary | ICD-10-CM | POA: Diagnosis not present

## 2017-06-26 DIAGNOSIS — K219 Gastro-esophageal reflux disease without esophagitis: Secondary | ICD-10-CM | POA: Diagnosis not present

## 2017-07-23 ENCOUNTER — Telehealth: Payer: Self-pay | Admitting: Gastroenterology

## 2017-07-23 NOTE — Telephone Encounter (Signed)
Spoke to patient she went to see her primary care doctor, asked for a refill of her Nexium 40 BID. That doctor did not refill medication and wanted her to check with our office first. Patient has not been taking it for last few day, having a lot of reflux, waking her up in the middle of the night with heartburn. She did try OTC Nexium, it helps some. She was asking about a breath test if Dr. Adela LankArmbruster could order that. I told her that it would be better to come in and be evaluated. I have made her a follow up appointment with [facility] for tomorrow. Advised her to not eat 3 hours prior to bedtime, continue OTC Nexium until her appointment tomorrow, sleep elevated.

## 2017-07-23 NOTE — Telephone Encounter (Signed)
Patient returning Julie's call °

## 2017-07-23 NOTE — Telephone Encounter (Signed)
Left message I was returning her call. 

## 2017-07-24 ENCOUNTER — Encounter: Payer: Self-pay | Admitting: Physician Assistant

## 2017-07-24 ENCOUNTER — Ambulatory Visit (INDEPENDENT_AMBULATORY_CARE_PROVIDER_SITE_OTHER): Payer: 59 | Admitting: Physician Assistant

## 2017-07-24 ENCOUNTER — Encounter (INDEPENDENT_AMBULATORY_CARE_PROVIDER_SITE_OTHER): Payer: Self-pay

## 2017-07-24 VITALS — BP 128/80 | HR 73 | Ht 64.5 in | Wt 142.0 lb

## 2017-07-24 DIAGNOSIS — E739 Lactose intolerance, unspecified: Secondary | ICD-10-CM

## 2017-07-24 DIAGNOSIS — K589 Irritable bowel syndrome without diarrhea: Secondary | ICD-10-CM

## 2017-07-24 DIAGNOSIS — K219 Gastro-esophageal reflux disease without esophagitis: Secondary | ICD-10-CM | POA: Diagnosis not present

## 2017-07-24 MED ORDER — VSL#3 PO CAPS: ORAL_CAPSULE | ORAL | 1 refills | Status: DC

## 2017-07-24 MED ORDER — ESOMEPRAZOLE MAGNESIUM 40 MG PO CPDR: DELAYED_RELEASE_CAPSULE | ORAL | 11 refills | Status: DC

## 2017-07-24 NOTE — Patient Instructions (Addendum)
  We have sent the following medications to your pharmacy for you to pick up at your convenience: [facility] [address] 1. VSL # 3 probiotic capsules.  2. Nexium 40 mg.   Lactose free diet.    [facility] as [facility] . He September schedule is not out yet but it will be by mid August.

## 2017-07-24 NOTE — Telephone Encounter (Signed)
Agree with your plan. I would continue nexium if it's helping. If symptoms mostly reflux from what you are saying, likely no need for breath test and would treat with PPI. thanks

## 2017-07-24 NOTE — Progress Notes (Signed)
Agree with assessment and plan as outlined.  

## 2017-07-24 NOTE — Progress Notes (Signed)
Subjective:    Patient ID: Abigail Allen, female    DOB: 07/31/1966, 51 y.o.   MRN: [id number]  HPI [place] BlasShirlene is a pleasant 51 year old African-American female known to Dr. Adela LankArmbruster. She comes in today to discuss her reflux symptoms and also issues with abdominal bloating and diarrhea. She had undergone colonoscopy in January 2018 which was a normal exam and EGD in December 2017 which was also normal. She had biopsies done to rule out eosinophilic esophagitis and these were negative as well. Patient had been taking Nexium 40 mg by mouth daily fairly long-term which did control her reflux symptoms. When seen here last winter she was asked to increase to twice a day dosing because of complaints of bloating etc. He has continued on this dose for the past several months. She followed up with her PCP who did not want to continue to refill Nexium since we had increased her to twice daily dosing. She says she ran out of medications last week and has been fairly miserable with heartburn and indigestion all day long over the past week or so. She says she also has sour brash when she bends over and has been having to sleep with the head of her bed elevated. She has other concerns today about whether she may have see below or IBS. She says she's been having a lot of problems with intermittent abdominal bloating and diarrhea and has soft stools off and on. She is not having symptoms constantly but says that she had an episode this past weekend after drinking coffee and creatinine which precipitated some urgency and diarrhea. She also had a recent episode after ice cream. She says she has known that she's been somewhat lactose intolerant for a while but had used nondairy creamer with her coffee this weekend. She asked about probiotics as well. She has been having or other GI symptoms for several months dating back prior to undergoing the colonoscopy.  Review of Systems Pertinent positive and negative review of  systems were noted in the above HPI section.  All other review of systems was otherwise negative.  Outpatient Encounter Prescriptions as of 07/24/2017  Medication Sig  . citalopram (CELEXA) 20 MG tablet Take 30 mg by mouth daily.  . clonazePAM (KLONOPIN) 0.5 MG tablet Take 0.5 mg by mouth 2 (two) times daily as needed for anxiety.  Marland Kitchen. esomeprazole (NEXIUM) 40 MG capsule Take 1 capsule by mouth every morning.  . Iron-Vitamin C (VITRON-C) 65-125 MG TABS Take 1 tablet by mouth daily.  . nitrofurantoin (MACRODANTIN) 50 MG capsule Take 50 mg by mouth. After intercourse  . SUMAtriptan (IMITREX) 100 MG tablet Take 100 mg by mouth as needed for migraine. May repeat in 2 hours if headache persists or recurs.  . traMADol (ULTRAM) 50 MG tablet Take 50-100 mg by mouth every 6 (six) hours as needed.  . vitamin B-12 (CYANOCOBALAMIN) 1000 MCG tablet Take 1,000 mcg by mouth daily.  Marland Kitchen. zolpidem (AMBIEN) 10 MG tablet Take 10 mg by mouth at bedtime as needed for sleep.  . [DISCONTINUED] esomeprazole (NEXIUM) 40 MG capsule Take 1 capsule (40 mg total) by mouth 2 (two) times daily before a meal.  . Probiotic Product (VSL#3) CAPS VSL #3,  112.5 billion per capsule.  Take 1 capsule twice daily.   Facility-Administered Encounter Medications as of 07/24/2017  Medication  . 0.9 %  sodium chloride infusion  . 0.9 %  sodium chloride infusion   No Known Allergies There are no active problems  to display for this patient.  Social History   Social History  . Marital status: Married    Spouse name: N/A  . Number of children: 1  . Years of education: N/A   Occupational History  . Not on file.   Social History Main Topics  . Smoking status: Never Smoker  . Smokeless tobacco: Never Used  . Alcohol use Yes     Comment: occasional  . Drug use: No  . Sexual activity: Yes    Partners: Male    Birth control/ protection: Surgical     Comment: Tubal   Other Topics Concern  . Not on file   Social History  Narrative  . No narrative on file    Ms. Sitar's family history includes Colon cancer in her brother; Hypertension in her brother.      Objective:    Vitals:   07/24/17 1401  BP: 128/80  Pulse: 73    Physical Exam well-developed African American female in no acute distress, pleasant blood pressure 128/80 pulse 73, height 5 foot 4, weight 142, BMI of 24. HEENT nontraumatic normocephalic EOMI PERRLA sclera anicteric-not further examined today.      Assessment & Plan:   #401 51 year old African-American female with chronic GERD #2 probable IBS and lactose intolerance   Plan; continue Nexium 40 mg,but decrease to once daily in a.m. She has tried H2 blockers in the past without benefit. Continue antireflux regimen Start lactose-free diet. I have asked her to follow a strict lactose-free diet over the next 4-6 weeks and assess for resolution of other GI symptoms. Patient wanted to try VSL-# 3 which she had read about for a probiotic, we will start 112.5 twice daily She will follow-up with Dr. Adela LankArmbruster or myself. I'm happy to see her back in September- if she is still having symptoms despite lactose-free diet. If continues with ongoing bloating and diarrhea consider empiric treatment with Xifaxan.  Nyko Gell Oswald HillockS Mari Battaglia PA-C 07/24/2017   Cc: Shaune PollackGates, Donna, MD

## 2017-08-14 DIAGNOSIS — K219 Gastro-esophageal reflux disease without esophagitis: Secondary | ICD-10-CM | POA: Diagnosis not present

## 2017-10-11 ENCOUNTER — Other Ambulatory Visit: Payer: Self-pay | Admitting: Physician Assistant

## 2017-12-17 ENCOUNTER — Other Ambulatory Visit: Payer: Self-pay | Admitting: Physician Assistant

## 2018-03-12 ENCOUNTER — Telehealth: Payer: Self-pay | Admitting: *Deleted

## 2018-03-12 ENCOUNTER — Other Ambulatory Visit: Payer: Self-pay | Admitting: Physician Assistant

## 2018-03-12 NOTE — Telephone Encounter (Signed)
LM for the patient, advising her that I got a request on her behalf for the VSL3 probiotic, 112.5 billion capsules.  I left a message telling her she can get this OTC and [facility] , [address], phone # [phone].

## 2018-05-12 ENCOUNTER — Telehealth: Payer: Self-pay | Admitting: Physician Assistant

## 2018-05-12 NOTE — Telephone Encounter (Signed)
Patient states she needs a [facility]

## 2018-05-15 ENCOUNTER — Other Ambulatory Visit: Payer: Self-pay | Admitting: *Deleted

## 2018-05-15 MED ORDER — VSL#3 PO CAPS: 1.0000 | ORAL_CAPSULE | Freq: Two times a day (BID) | ORAL | 2 refills | Status: DC

## 2018-05-15 NOTE — Telephone Encounter (Signed)
[facility]

## 2018-05-15 NOTE — Telephone Encounter (Signed)
LM for the patient to advise that I sent a script [facility] [address], [place] for VSL# 112 billion.  I also LM that we are told the cheapest in town for this probiotic is [facility], [employer], [place].  I left her the phone number for them of [phone].

## 2018-06-11 DIAGNOSIS — N951 Menopausal and female climacteric states: Secondary | ICD-10-CM | POA: Diagnosis not present

## 2018-06-11 DIAGNOSIS — E7800 Pure hypercholesterolemia, unspecified: Secondary | ICD-10-CM | POA: Diagnosis not present

## 2018-08-14 DIAGNOSIS — E7800 Pure hypercholesterolemia, unspecified: Secondary | ICD-10-CM | POA: Diagnosis not present

## 2018-08-14 DIAGNOSIS — D709 Neutropenia, unspecified: Secondary | ICD-10-CM | POA: Diagnosis not present

## 2018-08-25 ENCOUNTER — Other Ambulatory Visit: Payer: Self-pay | Admitting: Physician Assistant

## 2018-09-04 ENCOUNTER — Telehealth: Payer: Self-pay | Admitting: Physician Assistant

## 2018-09-04 ENCOUNTER — Other Ambulatory Visit: Payer: Self-pay | Admitting: *Deleted

## 2018-09-04 MED ORDER — ESOMEPRAZOLE MAGNESIUM 40 MG PO CPDR: DELAYED_RELEASE_CAPSULE | ORAL | 0 refills | Status: DC

## 2018-09-04 NOTE — Telephone Encounter (Signed)
Did advise patient that I sent # 30 tablets for Nexium 40 mg. l urged her to please keep her appointment on 09-24-2018 with Mike Gip PA for further refills.

## 2018-09-24 ENCOUNTER — Encounter (INDEPENDENT_AMBULATORY_CARE_PROVIDER_SITE_OTHER): Payer: Self-pay

## 2018-09-24 ENCOUNTER — Encounter: Payer: Self-pay | Admitting: Physician Assistant

## 2018-09-24 ENCOUNTER — Ambulatory Visit: Payer: 59 | Admitting: Physician Assistant

## 2018-09-24 VITALS — BP 112/62 | HR 88 | Ht 64.0 in | Wt 152.0 lb

## 2018-09-24 DIAGNOSIS — Z130 Encounter for screening for diseases of the blood and blood-forming organs and certain disorders involving the immune mechanism: Secondary | ICD-10-CM | POA: Diagnosis not present

## 2018-09-24 DIAGNOSIS — Z1389 Encounter for screening for other disorder: Secondary | ICD-10-CM | POA: Diagnosis not present

## 2018-09-24 DIAGNOSIS — Z01419 Encounter for gynecological examination (general) (routine) without abnormal findings: Secondary | ICD-10-CM | POA: Diagnosis not present

## 2018-09-24 DIAGNOSIS — K219 Gastro-esophageal reflux disease without esophagitis: Secondary | ICD-10-CM | POA: Diagnosis not present

## 2018-09-24 DIAGNOSIS — R140 Abdominal distension (gaseous): Secondary | ICD-10-CM

## 2018-09-24 DIAGNOSIS — Z1231 Encounter for screening mammogram for malignant neoplasm of breast: Secondary | ICD-10-CM | POA: Diagnosis not present

## 2018-09-24 NOTE — Progress Notes (Signed)
Subjective:    Patient ID: Abigail Allen, female    DOB: [birth year], 52 y.o.   MRN: [id number]  HPI Abigail Allen is a very nice 52 year old African American female known to Dr. Adela Lank with history of GERD and mild IBS symptoms with bloating.  She comes in today for annual follow-up. She had EGD in 2007 which was normal exam and had colonoscopy in January 2018 which was also normal. She is maintained on Nexium 40 mg p.o. every morning and says that this works very well.  If she misses a dose or 2 her symptoms will immediately recur and she is particularly bothered by nocturnal reflux type symptoms and will wake up with coughing and strangling at night.  Also developed heartburn and indigestion during the daytime.  Again the Nexium works well but she mentions she wishes she could find some way to not have to take it. She had tried Pepcid at one point in the past which caused diarrhea.  She is not particularly interested in decreasing the dose because she is afraid her symptoms will recur. She is currently taking digestive advantage probiotic.  She had previously taken VSL 3 which worked well.  She is not having a lot of difficulty currently with bloating or dyspepsia.  Review of Systems Pertinent positive and negative review of systems were noted in the above HPI section.  All other review of systems was otherwise negative.  Outpatient Encounter Medications as of 09/24/2018  Medication Sig  . citalopram (CELEXA) 20 MG tablet Take 30 mg by mouth daily.  . clonazePAM (KLONOPIN) 0.5 MG tablet Take 0.5 mg by mouth 2 (two) times daily as needed for anxiety.  Marland Kitchen esomeprazole (NEXIUM) 40 MG capsule TAKE 1 CAPSULE BY MOUTH EVERY DAY IN THE MORNING  . Iron-Vitamin C (VITRON-C) 65-125 MG TABS Take 1 tablet by mouth daily.  . nitrofurantoin (MACRODANTIN) 50 MG capsule Take 50 mg by mouth. After intercourse  . Probiotic Product (VSL#3) CAPS Take 1 capsule by mouth 2 (two) times daily.  . SUMAtriptan  (IMITREX) 100 MG tablet Take 100 mg by mouth as needed for migraine. May repeat in 2 hours if headache persists or recurs.  . traMADol (ULTRAM) 50 MG tablet Take 50-100 mg by mouth every 6 (six) hours as needed.  . vitamin B-12 (CYANOCOBALAMIN) 1000 MCG tablet Take 1,000 mcg by mouth daily.  Marland Kitchen zolpidem (AMBIEN) 10 MG tablet Take 10 mg by mouth at bedtime as needed for sleep.   Facility-Administered Encounter Medications as of 09/24/2018  Medication  . 0.9 %  sodium chloride infusion  . 0.9 %  sodium chloride infusion   No Known Allergies There are no active problems to display for this patient.  Social History   Socioeconomic History  . Marital status: Married    Spouse name: Not on file  . Number of children: 1  . Years of education: Not on file  . Highest education level: Not on file  Occupational History  . Not on file  Social Needs  . Financial resource strain: Not on file  . Food insecurity:    Worry: Not on file    Inability: Not on file  . Transportation needs:    Medical: Not on file    Non-medical: Not on file  Tobacco Use  . Smoking status: Never Smoker  . Smokeless tobacco: Never Used  Substance and Sexual Activity  . Alcohol use: Yes    Comment: occasional  . Drug use: No  . Sexual  activity: Yes    Partners: Male    Birth control/protection: Surgical    Comment: Tubal  Lifestyle  . Physical activity:    Days per week: Not on file    Minutes per session: Not on file  . Stress: Not on file  Relationships  . Social connections:    Talks on phone: Not on file    Gets together: Not on file    Attends religious service: Not on file    Active member of club or organization: Not on file    Attends meetings of clubs or organizations: Not on file    Relationship status: Not on file  . Intimate partner violence:    Fear of current or ex partner: Not on file    Emotionally abused: Not on file    Physically abused: Not on file    Forced sexual activity: Not on  file  Other Topics Concern  . Not on file  Social History Narrative  . Not on file    Abigail Allen's family history includes Colon cancer in her brother; Hypertension in her brother.      Objective:    Vitals:   09/24/18 0857  BP: 112/62  Pulse: 88    Physical Exam; well-developed African-American female in no acute distress pleasant blood pressure 112/62 pulse 88, height 5 foot 4, weight 152, BMI 26.0.  HEENT; nontraumatic normocephalic EOMI PERRLA sclera anicteric oral mucosa moist, Cardiovascular; regular rate and rhythm with S1-S2 no murmur rub or gallop, Pulmonary; clear bilaterally, Abdomen ;soft, nontender nondistended bowel sounds are active there is no palpable mass or hepatosplenomegaly, Rectal ;exam not done, Neuro psych; alert and oriented, grossly nonfocal mood and affect appropriate       Assessment & Plan:   #54 52 year old female with chronic GERD well-controlled on Nexium 40 mg p.o. every morning. Negative EGD 2007 #2  Intermittent bloating and dyspepsia-patient has had positive response to probiotics. #3 colon cancer surveillance-negative colonoscopy January 2018  Plan; We discussed options of switching to an H2 blocker or decreasing dose of Nexium however she is currently content with continuing on Nexium at 40 mg every morning. I discussed possibility of pursuing a TIF procedure in the future. She will continue daily probiotic, suggested she alternate products for microorganism diversity.  She can also try Visbiome (new name for VS L3.)  Which can be ordered online. Continue antireflux regimen. Patient will follow-up with Dr. Adela Lank or myself in 1 year or sooner as needed.  Merina Behrendt Oswald Hillock PA-C 09/24/2018   Cc: Shaune Pollack, MD

## 2018-09-24 NOTE — Patient Instructions (Addendum)
Continue probiotic daily. Thje new VSL #3- the new2 name is Visbiome ( you can order on line).   Continue Nexium 40 mg, take 1 by mouth every morning. Refills sent to pharmacy. Normal BMI (Body Mass Index- based on height and weight) is between 19 and 25. Your BMI today is Body mass index is 26.09 kg/m. Marland Kitchen Please consider follow up  regarding your BMI with your Primary Care Provider.  Follow up with Abigail Gip PA or Dr. Janalyn Allen in 1 year. Call us sooner if having problems.

## 2018-09-25 DIAGNOSIS — Z124 Encounter for screening for malignant neoplasm of cervix: Secondary | ICD-10-CM | POA: Diagnosis not present

## 2018-09-27 NOTE — Progress Notes (Signed)
Agree with assessment and plan as outlined. If she wanted to look into other options for reflux if she did not want to maintain on a PPI, I agree, evaluation for TIF would be reasonable if she does not have a large hiatal hernia. Otherwise, continue lowest dose of medication needed to control symptoms.

## 2018-09-28 ENCOUNTER — Other Ambulatory Visit: Payer: Self-pay | Admitting: Physician Assistant

## 2018-10-23 ENCOUNTER — Other Ambulatory Visit: Payer: Self-pay | Admitting: Physician Assistant

## 2018-11-24 DIAGNOSIS — M7712 Lateral epicondylitis, left elbow: Secondary | ICD-10-CM | POA: Diagnosis not present

## 2018-11-24 DIAGNOSIS — M25522 Pain in left elbow: Secondary | ICD-10-CM | POA: Diagnosis not present

## 2019-11-18 ENCOUNTER — Other Ambulatory Visit: Payer: Self-pay | Admitting: Physician Assistant

## 2020-01-07 ENCOUNTER — Encounter: Payer: Self-pay | Admitting: Gastroenterology

## 2020-01-07 ENCOUNTER — Ambulatory Visit: Payer: 59 | Admitting: Gastroenterology

## 2020-01-07 VITALS — BP 106/78 | HR 88 | Temp 98.4°F | Ht 65.5 in | Wt 157.2 lb

## 2020-01-07 DIAGNOSIS — K219 Gastro-esophageal reflux disease without esophagitis: Secondary | ICD-10-CM

## 2020-01-07 DIAGNOSIS — K5900 Constipation, unspecified: Secondary | ICD-10-CM | POA: Diagnosis not present

## 2020-01-07 MED ORDER — POLYETHYLENE GLYCOL 3350 17 G PO PACK: 17.0000 g | PACK | Freq: Every day | ORAL | 0 refills | Status: AC | PRN

## 2020-01-07 MED ORDER — ESOMEPRAZOLE MAGNESIUM 20 MG PO CPDR: DELAYED_RELEASE_CAPSULE | ORAL | 3 refills | Status: AC

## 2020-01-07 NOTE — Patient Instructions (Addendum)
If you are age 52 or older, your body mass index should be between 23-30. Your Body mass index is 25.77 kg/m. If this is out of the aforementioned range listed, please consider follow up with your Primary Care Provider.  If you are age 52 or younger, your body mass index should be between 19-25. Your Body mass index is 25.77 kg/m. If this is out of the aformentioned range listed, please consider follow up with your Primary Care Provider.   You have been scheduled for a Barium Esophogram at [facility] (1st floor of the hospital) on Tuesday, 01-18-20 at 11:00am. Please arrive 15 minutes prior to your appointment for registration. Make certain not to have anything to eat or drink 3 hours prior to your test. If you need to reschedule for any reason, please contact radiology at [phone] to do so. __________________________________________________________________ A barium swallow is an examination that concentrates on views of the esophagus. This tends to be a double contrast exam (barium and two liquids which, when combined, create a gas to distend the wall of the oesophagus) or single contrast (non-ionic iodine based). The study is usually tailored to your symptoms so a good history is essential. Attention is paid during the study to the form, structure and configuration of the esophagus, looking for functional disorders (such as aspiration, dysphagia, achalasia, motility and reflux) EXAMINATION You may be asked to change into a gown, depending on the type of swallow being performed. A radiologist and radiographer will perform the procedure. The radiologist will advise you of the type of contrast selected for your procedure and direct you during the exam. You will be asked to stand, sit or lie in several different positions and to hold a small amount of fluid in your mouth before being asked to swallow while the imaging is performed .In some instances you may be asked to swallow barium coated  marshmallows to assess the motility of a solid food bolus. The exam can be recorded as a digital or video fluoroscopy procedure. POST PROCEDURE It will take 1-2 days for the barium to pass through your system. To facilitate this, it is important, unless otherwise directed, to increase your fluids for the next 24-48hrs and to resume your normal diet.  This test typically takes about 30 minutes to perform. __________________________________________________________________________________   We are giving you samples of Miralax today. Take as directed, daily as needed.  We have sent the following medications to your pharmacy for you to pick up at your convenience: Nexium 20mg : Take once to twice daily as needed  We are giving you a handout regarding TIF today.  Thank you for entrusting me with your care and for choosing [facility], Dr. [facility]

## 2020-01-07 NOTE — Progress Notes (Signed)
HPI :  54 y/o female here for a folllow up visit, she has a history of GERD and prior colon cancer screening.  She has been on nexium 40mg  once daily for GERD. She has been on PPI longstanding for reflux. She states current dosing works pretty well for her, she has rare periodic breakthrough, sometimes needs to take it twice daily but this is not common. She has questions about long term regimen and safety of it. She denies dysphagia. No nausea / vomiting, no abdominal pains.   She has some baseline constipation at times. No blood in the stools. No abdominal pains. She has been using prune juice PRN to manage this.   Otherwise feels well without complaints today. Is running out of Nexium and needs a refill.   Prior workup: Colonoscopy 01/18/2017 - diverticulosis, otherwise normal  EGD - 12/25/2016 - normal exam, empiric dilation of esophagus done and biopsies taken showing no abnormalities  Past Medical History:  Diagnosis Date  . Anemia   . Anxiety    Dr. Inda Merlin, [place]  . Arthritis   . GERD (gastroesophageal reflux disease)   . Insomnia   . Migraine      Past Surgical History:  Procedure Laterality Date  . TUBAL LIGATION  2010  . UPPER GASTROINTESTINAL ENDOSCOPY     Family History  Problem Relation Age of Onset  . Colon cancer Brother   . Hypertension Brother   . Esophageal cancer Neg Hx   . Stomach cancer Neg Hx   . Rectal cancer Neg Hx    Social History   Tobacco Use  . Smoking status: Never Smoker  . Smokeless tobacco: Never Used  Substance Use Topics  . Alcohol use: Yes    Comment: occasional  . Drug use: No   Current Outpatient Medications  Medication Sig Dispense Refill  . citalopram (CELEXA) 20 MG tablet Take 30 mg by mouth daily.    Marland Kitchen esomeprazole (NEXIUM) 40 MG capsule TAKE 1 CAPSULE BY MOUTH EVERY DAY IN THE MORNING 90 capsule 3  . Iron-Vitamin C (VITRON-C) 65-125 MG TABS Take 1 tablet by mouth daily.    . nitrofurantoin (MACRODANTIN) 50 MG capsule  Take 50 mg by mouth. After intercourse    . SUMAtriptan (IMITREX) 100 MG tablet Take 100 mg by mouth as needed for migraine. May repeat in 2 hours if headache persists or recurs.    . traMADol (ULTRAM) 50 MG tablet Take 50-100 mg by mouth every 6 (six) hours as needed.    . vitamin B-12 (CYANOCOBALAMIN) 1000 MCG tablet Take 1,000 mcg by mouth daily.    Marland Kitchen zolpidem (AMBIEN) 10 MG tablet Take 10 mg by mouth at bedtime as needed for sleep.     Current Facility-Administered Medications  Medication Dose Route Frequency Provider Last Rate Last Admin  . 0.9 %  sodium chloride infusion  500 mL Intravenous Continuous Marcio Hoque, Carlota Raspberry, MD       No Known Allergies   Review of Systems: All systems reviewed and negative except where noted in HPI.    No results found.  Physical Exam: BP 106/78   Pulse 88   Temp 98.4 F (36.9 C)   Ht 5' 5.5" (1.664 m)   Wt 157 lb 4 oz (71.3 kg)   BMI 25.77 kg/m  Constitutional: Pleasant,well-developed, female in no acute distress. HEENT: Normocephalic and atraumatic. Conjunctivae are normal. No scleral icterus. Neck supple.  Cardiovascular: Normal rate, regular rhythm.  Pulmonary/chest: Effort normal and breath sounds  normal. No wheezing, rales or rhonchi. Abdominal: Soft, nondistended, nontender. . There are no masses palpable. No hepatomegaly. Extremities: no edema Lymphadenopathy: No cervical adenopathy noted. Neurological: Alert and oriented to person place and time. Skin: Skin is warm and dry. No rashes noted. Psychiatric: Normal mood and affect. Behavior is normal.   ASSESSMENT AND PLAN: 54 y/o female here for reassessment of the following issues:  GERD - longstanding symptoms, controlled with PPIs but we discussed long term risks / benefits of chronic PPI use. If she can lower the dose to control symptoms, I recommend trying to do so. She's done okay with the present dose and wishes to reduce the dose to 20mg  once daily as a trial. If she needs  the 40mg  dose she can take 2 x 20mg  tabs. Otherwise she has no Barrett's and is interested in coming off PPI if there are other regimens available. We discussed TIF and surgery. I think she may be a good candidate for TIF. We discussed what this is and what would be needed to see if she is a candidate for it. Following this discussion, she wants to pursue an evaluation for TIF. Will refer her for barium swallow, evaluate for objective evidence of reflux, ensure no dysmotility. If that looks okay will have her evaluated by Dr. for TIF. Educational handouts provided at the visit today about TIF so she can read more about it, will let her know results of barium study when available.   Constipation - discussed options. Can continue to manage this with dietary measures if that works for her. Otherwise gave her some samples of Miralax, can try to use this PRN if that works better for her. Colonoscopy up to date. All questions answered.  I spent 30 minutes of time, including in depth chart review, independent review of results as outlined above, communicating results with the patient directly, face-to-face time with the patient, coordinating care, and ordering studies and medications as appropriate, and documenting this encounter.    , MD [facility]

## 2020-01-14 ENCOUNTER — Telehealth: Payer: Self-pay | Admitting: Gastroenterology

## 2020-01-14 NOTE — Telephone Encounter (Signed)
Okay thanks, she can follow up with me as needed if she wishes to reconsider

## 2020-01-18 ENCOUNTER — Ambulatory Visit (HOSPITAL_COMMUNITY): Payer: 59

## 2022-09-18 ENCOUNTER — Other Ambulatory Visit: Payer: Self-pay | Admitting: Surgery
